# Patient Record
Sex: Male | Born: 1980 | Race: White | Hispanic: No | Marital: Married | Smoking: Never smoker
Health system: Southern US, Community
[De-identification: ages and names within clinical notes are randomized; demographics above are authoritative.]

## PROBLEM LIST (undated history)

## (undated) DIAGNOSIS — T7840XA Allergy, unspecified, initial encounter: Secondary | ICD-10-CM

## (undated) DIAGNOSIS — F419 Anxiety disorder, unspecified: Secondary | ICD-10-CM

## (undated) HISTORY — DX: Allergy, unspecified, initial encounter: T78.40XA

## (undated) HISTORY — DX: Anxiety disorder, unspecified: F41.9

---

## 2003-08-20 ENCOUNTER — Encounter: Admission: RE | Admit: 2003-08-20 | Discharge: 2003-08-20 | Payer: Self-pay | Admitting: Internal Medicine

## 2012-10-30 ENCOUNTER — Ambulatory Visit (INDEPENDENT_AMBULATORY_CARE_PROVIDER_SITE_OTHER): Payer: 59 | Admitting: Emergency Medicine

## 2012-10-30 VITALS — BP 112/80 | HR 70 | Temp 97.7°F | Resp 16 | Ht 74.0 in | Wt 198.0 lb

## 2012-10-30 DIAGNOSIS — M549 Dorsalgia, unspecified: Secondary | ICD-10-CM

## 2012-10-30 DIAGNOSIS — R070 Pain in throat: Secondary | ICD-10-CM

## 2012-10-30 MED ORDER — CYCLOBENZAPRINE HCL 10 MG PO TABS: ORAL_TABLET | ORAL | Status: DC

## 2012-10-30 MED ORDER — MELOXICAM 15 MG PO TABS: 15.0000 mg | ORAL_TABLET | Freq: Every day | ORAL | Status: DC

## 2012-10-30 NOTE — Patient Instructions (Addendum)
Take the muscle relaxer once a day with food. Take Flexeril at night. Stretch after hot shower. The spot on your throat is a tonsillith. Gargle with salt water and it should come out with food.  Low Back Strain with Rehab A strain is an injury in which a tendon or muscle is torn. The muscles and tendons of the lower back are vulnerable to strains. However, these muscles and tendons are very strong and require a great force to be injured. Strains are classified into three categories. Grade 1 strains cause pain, but the tendon is not lengthened. Grade 2 strains include a lengthened ligament, due to the ligament being stretched or partially ruptured. With grade 2 strains there is still function, although the function may be decreased. Grade 3 strains involve a complete tear of the tendon or muscle, and function is usually impaired. SYMPTOMS   Pain in the lower back.  Pain that affects one side more than the other.  Pain that gets worse with movement and may be felt in the hip, buttocks, or back of the thigh.  Muscle spasms of the muscles in the back.  Swelling along the muscles of the back.  Loss of strength of the back muscles.  Crackling sound (crepitation) when the muscles are touched. CAUSES  Lower back strains occur when a force is placed on the muscles or tendons that is greater than they can handle. Common causes of injury include:  Prolonged overuse of the muscle-tendon units in the lower back, usually from incorrect posture.  A single violent injury or force applied to the back. RISK INCREASES WITH:  Sports that involve twisting forces on the spine or a lot of bending at the waist (football, rugby, weightlifting, bowling, golf, tennis, speed skating, racquetball, swimming, running, gymnastics, diving).  Poor strength and flexibility.  Failure to warm up properly before activity.  Family history of lower back pain or disk disorders.  Previous back injury or surgery  (especially fusion).  Poor posture with lifting, especially heavy objects.  Prolonged sitting, especially with poor posture. PREVENTION   Learn and use proper posture when sitting or lifting (maintain proper posture when sitting, lift using the knees and legs, not at the waist).  Warm up and stretch properly before activity.  Allow for adequate recovery between workouts.  Maintain physical fitness:  Strength, flexibility, and endurance.  Cardiovascular fitness. PROGNOSIS  If treated properly, lower back strains usually heal within 6 weeks. RELATED COMPLICATIONS   Recurring symptoms, resulting in a chronic problem.  Chronic inflammation, scarring, and partial muscle-tendon tear.  Delayed healing or resolution of symptoms.  Prolonged disability. TREATMENT  Treatment first involves the use of ice and medicine, to reduce pain and inflammation. The use of strengthening and stretching exercises may help reduce pain with activity. These exercises may be performed at home or with a therapist. Severe injuries may require referral to a therapist for further evaluation and treatment, such as ultrasound. Your caregiver may advise that you wear a back brace or corset, to help reduce pain and discomfort. Often, prolonged bed rest results in greater harm then benefit. Corticosteroid injections may be recommended. However, these should be reserved for the most serious cases. It is important to avoid using your back when lifting objects. At night, sleep on your back on a firm mattress with a pillow placed under your knees. If non-surgical treatment is unsuccessful, surgery may be needed.  MEDICATION   If pain medicine is needed, nonsteroidal anti-inflammatory medicines (aspirin and ibuprofen),  or other minor pain relievers (acetaminophen), are often advised.  Do not take pain medicine for 7 days before surgery.  Prescription pain relievers may be given, if your caregiver thinks they are needed.  Use only as directed and only as much as you need.  Ointments applied to the skin may be helpful.  Corticosteroid injections may be given by your caregiver. These injections should be reserved for the most serious cases, because they may only be given a certain number of times. HEAT AND COLD  Cold treatment (icing) should be applied for 10 to 15 minutes every 2 to 3 hours for inflammation and pain, and immediately after activity that aggravates your symptoms. Use ice packs or an ice massage.  Heat treatment may be used before performing stretching and strengthening activities prescribed by your caregiver, physical therapist, or athletic trainer. Use a heat pack or a warm water soak. SEEK MEDICAL CARE IF:   Symptoms get worse or do not improve in 2 to 4 weeks, despite treatment.  You develop numbness, weakness, or loss of bowel or bladder function.  New, unexplained symptoms develop. (Drugs used in treatment may produce side effects.) EXERCISES  RANGE OF MOTION (ROM) AND STRETCHING EXERCISES - Low Back Strain Most people with lower back pain will find that their symptoms get worse with excessive bending forward (flexion) or arching at the lower back (extension). The exercises which will help resolve your symptoms will focus on the opposite motion.  Your physician, physical therapist or athletic trainer will help you determine which exercises will be most helpful to resolve your lower back pain. Do not complete any exercises without first consulting with your caregiver. Discontinue any exercises which make your symptoms worse until you speak to your caregiver.  If you have pain, numbness or tingling which travels down into your buttocks, leg or foot, the goal of the therapy is for these symptoms to move closer to your back and eventually resolve. Sometimes, these leg symptoms will get better, but your lower back pain may worsen. This is typically an indication of progress in your rehabilitation.  Be very alert to any changes in your symptoms and the activities in which you participated in the 24 hours prior to the change. Sharing this information with your caregiver will allow him/her to most efficiently treat your condition.  These exercises may help you when beginning to rehabilitate your injury. Your symptoms may resolve with or without further involvement from your physician, physical therapist or athletic trainer. While completing these exercises, remember:  Restoring tissue flexibility helps normal motion to return to the joints. This allows healthier, less painful movement and activity.  An effective stretch should be held for at least 30 seconds.  A stretch should never be painful. You should only feel a gentle lengthening or release in the stretched tissue. FLEXION RANGE OF MOTION AND STRETCHING EXERCISES: STRETCH  Flexion, Single Knee to Chest   Lie on a firm bed or floor with both legs extended in front of you.  Keeping one leg in contact with the floor, bring your opposite knee to your chest. Hold your leg in place by either grabbing behind your thigh or at your knee.  Pull until you feel a gentle stretch in your lower back. Hold __________ seconds.  Slowly release your grasp and repeat the exercise with the opposite side. Repeat __________ times. Complete this exercise __________ times per day.  STRETCH  Flexion, Double Knee to Chest   Lie on a firm bed  or floor with both legs extended in front of you.  Keeping one leg in contact with the floor, bring your opposite knee to your chest.  Tense your stomach muscles to support your back and then lift your other knee to your chest. Hold your legs in place by either grabbing behind your thighs or at your knees.  Pull both knees toward your chest until you feel a gentle stretch in your lower back. Hold __________ seconds.  Tense your stomach muscles and slowly return one leg at a time to the floor. Repeat __________  times. Complete this exercise __________ times per day.  STRETCH  Low Trunk Rotation  Lie on a firm bed or floor. Keeping your legs in front of you, bend your knees so they are both pointed toward the ceiling and your feet are flat on the floor.  Extend your arms out to the side. This will stabilize your upper body by keeping your shoulders in contact with the floor.  Gently and slowly drop both knees together to one side until you feel a gentle stretch in your lower back. Hold for __________ seconds.  Tense your stomach muscles to support your lower back as you bring your knees back to the starting position. Repeat the exercise to the other side. Repeat __________ times. Complete this exercise __________ times per day  EXTENSION RANGE OF MOTION AND FLEXIBILITY EXERCISES: STRETCH  Extension, Prone on Elbows   Lie on your stomach on the floor, a bed will be too soft. Place your palms about shoulder width apart and at the height of your head.  Place your elbows under your shoulders. If this is too painful, stack pillows under your chest.  Allow your body to relax so that your hips drop lower and make contact more completely with the floor.  Hold this position for __________ seconds.  Slowly return to lying flat on the floor. Repeat __________ times. Complete this exercise __________ times per day.  RANGE OF MOTION  Extension, Prone Press Ups  Lie on your stomach on the floor, a bed will be too soft. Place your palms about shoulder width apart and at the height of your head.  Keeping your back as relaxed as possible, slowly straighten your elbows while keeping your hips on the floor. You may adjust the placement of your hands to maximize your comfort. As you gain motion, your hands will come more underneath your shoulders.  Hold this position __________ seconds.  Slowly return to lying flat on the floor. Repeat __________ times. Complete this exercise __________ times per day.  RANGE  OF MOTION- Quadruped, Neutral Spine   Assume a hands and knees position on a firm surface. Keep your hands under your shoulders and your knees under your hips. You may place padding under your knees for comfort.  Drop your head and point your tail bone toward the ground below you. This will round out your lower back like an angry cat. Hold this position for __________ seconds.  Slowly lift your head and release your tail bone so that your back sags into a large arch, like an old horse.  Hold this position for __________ seconds.  Repeat this until you feel limber in your lower back.  Now, find your "sweet spot." This will be the most comfortable position somewhere between the two previous positions. This is your neutral spine. Once you have found this position, tense your stomach muscles to support your lower back.  Hold this position for __________  seconds. Repeat __________ times. Complete this exercise __________ times per day.  STRENGTHENING EXERCISES - Low Back Strain These exercises may help you when beginning to rehabilitate your injury. These exercises should be done near your "sweet spot." This is the neutral, low-back arch, somewhere between fully rounded and fully arched, that is your least painful position. When performed in this safe range of motion, these exercises can be used for people who have either a flexion or extension based injury. These exercises may resolve your symptoms with or without further involvement from your physician, physical therapist or athletic trainer. While completing these exercises, remember:   Muscles can gain both the endurance and the strength needed for everyday activities through controlled exercises.  Complete these exercises as instructed by your physician, physical therapist or athletic trainer. Increase the resistance and repetitions only as guided.  You may experience muscle soreness or fatigue, but the pain or discomfort you are trying to  eliminate should never worsen during these exercises. If this pain does worsen, stop and make certain you are following the directions exactly. If the pain is still present after adjustments, discontinue the exercise until you can discuss the trouble with your caregiver. STRENGTHENING Deep Abdominals, Pelvic Tilt  Lie on a firm bed or floor. Keeping your legs in front of you, bend your knees so they are both pointed toward the ceiling and your feet are flat on the floor.  Tense your lower abdominal muscles to press your lower back into the floor. This motion will rotate your pelvis so that your tail bone is scooping upwards rather than pointing at your feet or into the floor.  With a gentle tension and even breathing, hold this position for __________ seconds. Repeat __________ times. Complete this exercise __________ times per day.  STRENGTHENING  Abdominals, Crunches   Lie on a firm bed or floor. Keeping your legs in front of you, bend your knees so they are both pointed toward the ceiling and your feet are flat on the floor. Cross your arms over your chest.  Slightly tip your chin down without bending your neck.  Tense your abdominals and slowly lift your trunk high enough to just clear your shoulder blades. Lifting higher can put excessive stress on the lower back and does not further strengthen your abdominal muscles.  Control your return to the starting position. Repeat __________ times. Complete this exercise __________ times per day.  STRENGTHENING  Quadruped, Opposite UE/LE Lift   Assume a hands and knees position on a firm surface. Keep your hands under your shoulders and your knees under your hips. You may place padding under your knees for comfort.  Find your neutral spine and gently tense your abdominal muscles so that you can maintain this position. Your shoulders and hips should form a rectangle that is parallel with the floor and is not twisted.  Keeping your trunk steady,  lift your right hand no higher than your shoulder and then your left leg no higher than your hip. Make sure you are not holding your breath. Hold this position __________ seconds.  Continuing to keep your abdominal muscles tense and your back steady, slowly return to your starting position. Repeat with the opposite arm and leg. Repeat __________ times. Complete this exercise __________ times per day.  STRENGTHENING  Lower Abdominals, Double Knee Lift  Lie on a firm bed or floor. Keeping your legs in front of you, bend your knees so they are both pointed toward the ceiling and your  feet are flat on the floor.  Tense your abdominal muscles to brace your lower back and slowly lift both of your knees until they come over your hips. Be certain not to hold your breath.  Hold __________ seconds. Using your abdominal muscles, return to the starting position in a slow and controlled manner. Repeat __________ times. Complete this exercise __________ times per day.  POSTURE AND BODY MECHANICS CONSIDERATIONS - Low Back Strain Keeping correct posture when sitting, standing or completing your activities will reduce the stress put on different body tissues, allowing injured tissues a chance to heal and limiting painful experiences. The following are general guidelines for improved posture. Your physician or physical therapist will provide you with any instructions specific to your needs. While reading these guidelines, remember:  The exercises prescribed by your provider will help you have the flexibility and strength to maintain correct postures.  The correct posture provides the best environment for your joints to work. All of your joints have less wear and tear when properly supported by a spine with good posture. This means you will experience a healthier, less painful body.  Correct posture must be practiced with all of your activities, especially prolonged sitting and standing. Correct posture is as  important when doing repetitive low-stress activities (typing) as it is when doing a single heavy-load activity (lifting). RESTING POSITIONS Consider which positions are most painful for you when choosing a resting position. If you have pain with flexion-based activities (sitting, bending, stooping, squatting), choose a position that allows you to rest in a less flexed posture. You would want to avoid curling into a fetal position on your side. If your pain worsens with extension-based activities (prolonged standing, working overhead), avoid resting in an extended position such as sleeping on your stomach. Most people will find more comfort when they rest with their spine in a more neutral position, neither too rounded nor too arched. Lying on a non-sagging bed on your side with a pillow between your knees, or on your back with a pillow under your knees will often provide some relief. Keep in mind, being in any one position for a prolonged period of time, no matter how correct your posture, can still lead to stiffness. PROPER SITTING POSTURE In order to minimize stress and discomfort on your spine, you must sit with correct posture. Sitting with good posture should be effortless for a healthy body. Returning to good posture is a gradual process. Many people can work toward this most comfortably by using various supports until they have the flexibility and strength to maintain this posture on their own. When sitting with proper posture, your ears will fall over your shoulders and your shoulders will fall over your hips. You should use the back of the chair to support your upper back. Your lower back will be in a neutral position, just slightly arched. You may place a small pillow or folded towel at the base of your lower back for support.  When working at a desk, create an environment that supports good, upright posture. Without extra support, muscles tire, which leads to excessive strain on joints and other  tissues. Keep these recommendations in mind: CHAIR:  A chair should be able to slide under your desk when your back makes contact with the back of the chair. This allows you to work closely.  The chair's height should allow your eyes to be level with the upper part of your monitor and your hands to be slightly lower than  your elbows. BODY POSITION  Your feet should make contact with the floor. If this is not possible, use a foot rest.  Keep your ears over your shoulders. This will reduce stress on your neck and lower back. INCORRECT SITTING POSTURES  If you are feeling tired and unable to assume a healthy sitting posture, do not slouch or slump. This puts excessive strain on your back tissues, causing more damage and pain. Healthier options include:  Using more support, like a lumbar pillow.  Switching tasks to something that requires you to be upright or walking.  Talking a brief walk.  Lying down to rest in a neutral-spine position. PROLONGED STANDING WHILE SLIGHTLY LEANING FORWARD  When completing a task that requires you to lean forward while standing in one place for a long time, place either foot up on a stationary 2-4 inch high object to help maintain the best posture. When both feet are on the ground, the lower back tends to lose its slight inward curve. If this curve flattens (or becomes too large), then the back and your other joints will experience too much stress, tire more quickly, and can cause pain. CORRECT STANDING POSTURES Proper standing posture should be assumed with all daily activities, even if they only take a few moments, like when brushing your teeth. As in sitting, your ears should fall over your shoulders and your shoulders should fall over your hips. You should keep a slight tension in your abdominal muscles to brace your spine. Your tailbone should point down to the ground, not behind your body, resulting in an over-extended swayback posture.  INCORRECT STANDING  POSTURES  Common incorrect standing postures include a forward head, locked knees and/or an excessive swayback. WALKING Walk with an upright posture. Your ears, shoulders and hips should all line-up. PROLONGED ACTIVITY IN A FLEXED POSITION When completing a task that requires you to bend forward at your waist or lean over a low surface, try to find a way to stabilize 3 out of 4 of your limbs. You can place a hand or elbow on your thigh or rest a knee on the surface you are reaching across. This will provide you more stability so that your muscles do not fatigue as quickly. By keeping your knees relaxed, or slightly bent, you will also reduce stress across your lower back. CORRECT LIFTING TECHNIQUES DO :   Assume a wide stance. This will provide you more stability and the opportunity to get as close as possible to the object which you are lifting.  Tense your abdominals to brace your spine. Bend at the knees and hips. Keeping your back locked in a neutral-spine position, lift using your leg muscles. Lift with your legs, keeping your back straight.  Test the weight of unknown objects before attempting to lift them.  Try to keep your elbows locked down at your sides in order get the best strength from your shoulders when carrying an object.  Always ask for help when lifting heavy or awkward objects. INCORRECT LIFTING TECHNIQUES DO NOT:   Lock your knees when lifting, even if it is a small object.  Bend and twist. Pivot at your feet or move your feet when needing to change directions.  Assume that you can safely pick up even a paper clip without proper posture. Document Released: 06/26/2005 Document Revised: 09/18/2011 Document Reviewed: 10/08/2008 [facility] Patient Information 2013 [place], [place].

## 2012-10-30 NOTE — Progress Notes (Signed)
  Subjective:    Patient ID: Dillon Horne, male    DOB: 1980/12/26, 32 y.o.   MRN: [id number]  HPI 32 Year old Pt presentss to clinic today with 2 complaints.  First complaint is he is having some LBP that is Lt sided and radiating up towards his mid-back. Pt states he mowed his lawn Monday and lifted a box about 30 pounds yesterday afternoon. He noticed the pain yesterday after lifting the box from the ground to up over his head to put into a closet. At some point he then reached up and got the box down again. His back began to get "hot" and tense up yesterday evening- he applied ice to the area and took Advil yesterday at 5pm and at 10pm- he got a little relief. This morning he woke with his back more tensed up and tried to run some errands- he has not taken anything yet today. Second complaint is his scratchy, sore throat he has had off and on for about two months. He states that it comes and goes- when he feels fatigued and run down is when his throat hurts the worst. He noticed last week a white spot on his tonsil.     Review of Systems  Constitutional: Negative for fever and chills.  Musculoskeletal: Positive for back pain.       Objective:   Physical Exam  Constitutional: He is oriented to person, place, and time. He appears well-developed and well-nourished.  HENT:  Head: Normocephalic and atraumatic.  Small white patch on R tonsil. No acute erythema.  Ears clear. No signs of abscess.  Neck: Normal range of motion. Neck supple.  No lymphadenopathy  Cardiovascular: Normal rate, regular rhythm and normal heart sounds.   Pulmonary/Chest: Effort normal and breath sounds normal.  Musculoskeletal:       Lumbar back: He exhibits decreased range of motion, tenderness (mild Left sided) and spasm. He exhibits no bony tenderness and no swelling.       Arms: Hypertonic muscles on the left lower back.  Straight leg raise is negative bilaterally. DTRs normal.  Neurological: He is alert  and oriented to person, place, and time.  Skin: Skin is warm and dry.   Small tonsillith on R side.         Assessment & Plan:  Back strain. Given muscle relaxer to take with food. Continue over the counter NSAIDs for pain. Spot on throat is a tonsillith. Attempted to remove with swab, but it did not come out. Advised to gargle saltwater until it comes out.

## 2012-11-03 ENCOUNTER — Telehealth: Payer: Self-pay

## 2012-11-03 NOTE — Telephone Encounter (Signed)
PATIENT'S WIFE CALLED TO SAY THAT DR. DAUB SAW HER HUSBAND FOR BACK PAIN. DR. Cleta Alberts GAVE HIM AN ANTI-INFLAMMATORY AND A MUSCLE RELAXER. TODAY HE IS IN A LOT OF PAIN. SHE WOULD LIKE TO KNOW WHAT ELSE HE CAN DO? HE HAS NOT TAKEN ANY TYLENOL OR ADVIL.  BEST PHONE [phone] ([facility])   PHARMACY CHOICE IS [facility] ON [address].  MBC

## 2012-11-03 NOTE — Telephone Encounter (Signed)
Called and discussed with patient. He can take his Flexeril 3 times a day during the day and supplement this with extra strength Tylenol as needed

## 2013-07-25 ENCOUNTER — Other Ambulatory Visit: Payer: Self-pay | Admitting: Internal Medicine

## 2013-07-25 DIAGNOSIS — J329 Chronic sinusitis, unspecified: Secondary | ICD-10-CM

## 2013-08-01 ENCOUNTER — Ambulatory Visit
Admission: RE | Admit: 2013-08-01 | Discharge: 2013-08-01 | Disposition: A | Discharge status: Home / Self Care | Payer: 59 | Source: Ambulatory Visit | Attending: Internal Medicine | Admitting: Internal Medicine

## 2013-08-01 DIAGNOSIS — J329 Chronic sinusitis, unspecified: Secondary | ICD-10-CM

## 2014-06-15 ENCOUNTER — Ambulatory Visit (INDEPENDENT_AMBULATORY_CARE_PROVIDER_SITE_OTHER): Payer: 59 | Admitting: Internal Medicine

## 2014-06-15 VITALS — BP 122/84 | HR 74 | Temp 98.2°F | Resp 18 | Ht 75.0 in | Wt 204.8 lb

## 2014-06-15 DIAGNOSIS — R6883 Chills (without fever): Secondary | ICD-10-CM

## 2014-06-15 DIAGNOSIS — R5383 Other fatigue: Secondary | ICD-10-CM

## 2014-06-15 DIAGNOSIS — J039 Acute tonsillitis, unspecified: Secondary | ICD-10-CM

## 2014-06-15 DIAGNOSIS — R61 Generalized hyperhidrosis: Secondary | ICD-10-CM

## 2014-06-15 LAB — POCT RAPID STREP A (OFFICE): Rapid Strep A Screen: NEGATIVE

## 2014-06-15 MED ORDER — AZITHROMYCIN 500 MG PO TABS: 500.0000 mg | ORAL_TABLET | Freq: Every day | ORAL | Status: DC

## 2014-06-15 NOTE — Progress Notes (Signed)
   Subjective:    Patient ID: Dillon Horne, male    DOB: Feb 14, 1981, 33 y.o.   MRN: [id number]  HPI 5days, st,fatigue, diaphoresis. Also a sore in beard after shaving, has now resolved.   Review of Systems     Objective:   Physical Exam  Constitutional: He is oriented to person, place, and time. He appears well-developed and well-nourished. No distress.  HENT:  Head: Normocephalic.  Right Ear: External ear normal.  Left Ear: External ear normal.  Nose: Nose normal.  Mouth/Throat: Oropharyngeal exudate present.  Eyes: Conjunctivae and EOM are normal. Pupils are equal, round, and reactive to light.  Neck: Normal range of motion. Neck supple.  Cardiovascular: Normal rate, regular rhythm and normal heart sounds.   Musculoskeletal: Normal range of motion.  Neurological: He is alert and oriented to person, place, and time. He exhibits normal muscle tone. Coordination normal.  Skin: No rash noted.  Psychiatric: He has a normal mood and affect.  Vitals reviewed. No lesion seen on face  Results for orders placed or performed in visit on 06/15/14  POCT rapid strep A  Result Value Ref Range   Rapid Strep A Screen Negative Negative         Assessment & Plan:  Tonsillitis Zithromax 500mg Christen Bame/Throat care/Take 3 days zithromax

## 2014-06-15 NOTE — Patient Instructions (Signed)

## 2015-01-22 IMAGING — CT CT MAXILLOFACIAL W/O CM
3 of 6 series · 16 of 47 positions shown, 19 images · non-contrast
Comparison: None.

CLINICAL DATA: Sinus pressure. Frontal and occipital headache.
Drainage. Congestion.

EXAM:
CT MAXILLOFACIAL WITHOUT CONTRAST
TECHNIQUE: Multidetector CT imaging of the maxillofacial structures was
performed. Multiplanar CT image reconstructions were also generated.
A small metallic BB was placed on the right temple in order to
reliably differentiate right from left.

[Series 200: cor · coronal · 0.29mm/px · 3 of 71 slices shown]
[im 24/71  bone]
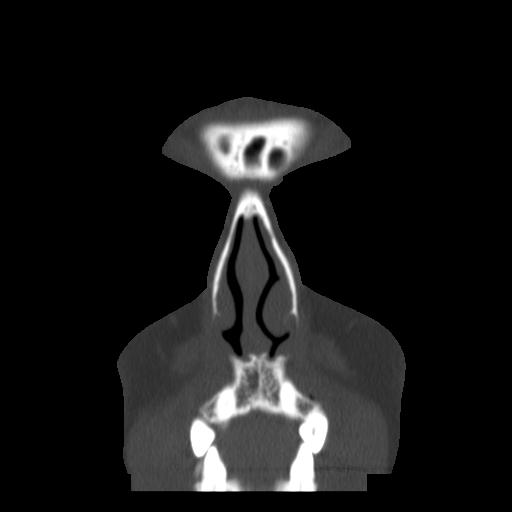
[im 32/71  bone]
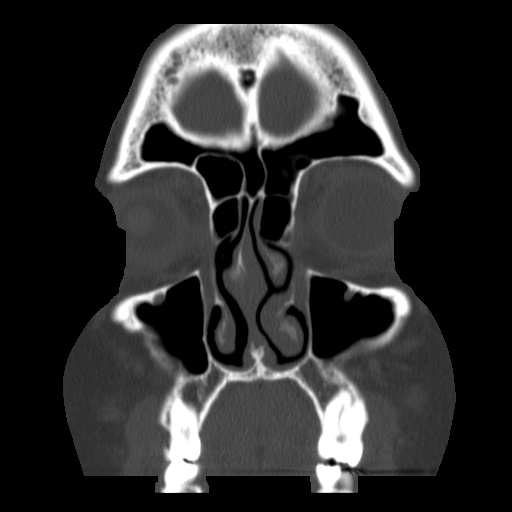
[im 39/71  bone]
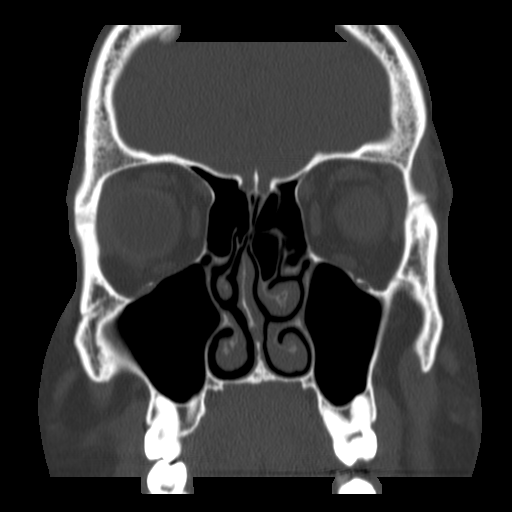

[Series 601: coronal facial · coronal · 0.29mm/px · 2 of 74 slices shown]
[im 25/74  bone]
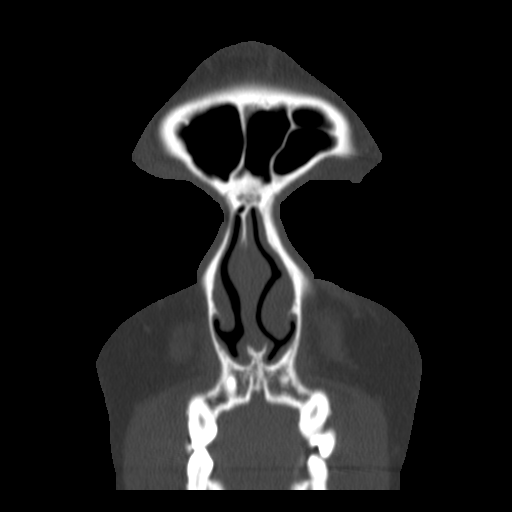
[im 49/74  bone]
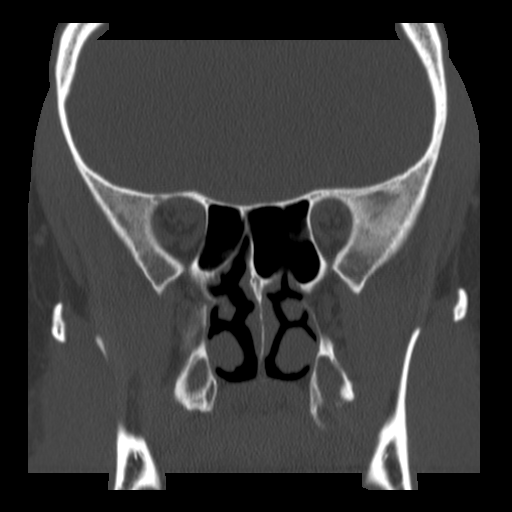

[Series 602: sagittal facial · sagittal · 0.29mm/px · 11 of 75 slices shown, 14 images]
[im 5/75  brain]
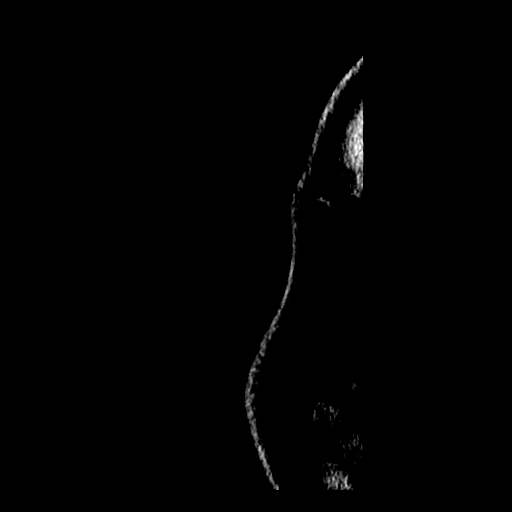
[im 5/75  bone]
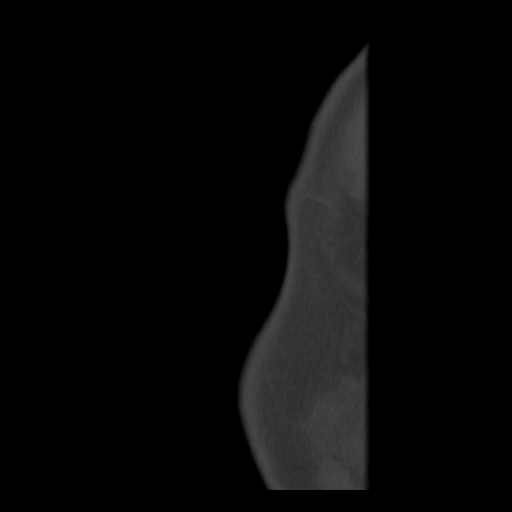
[im 14/75  bone]
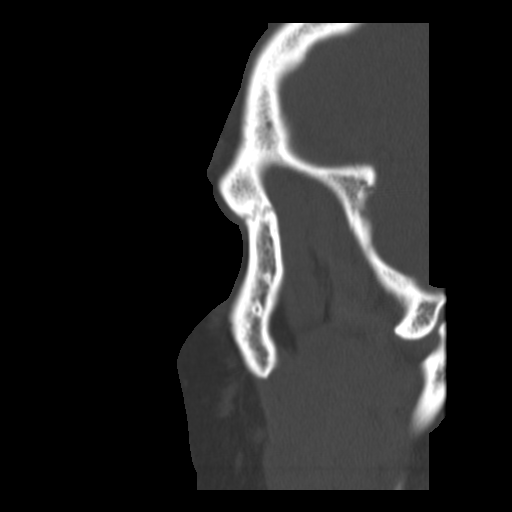
[im 19/75  bone]
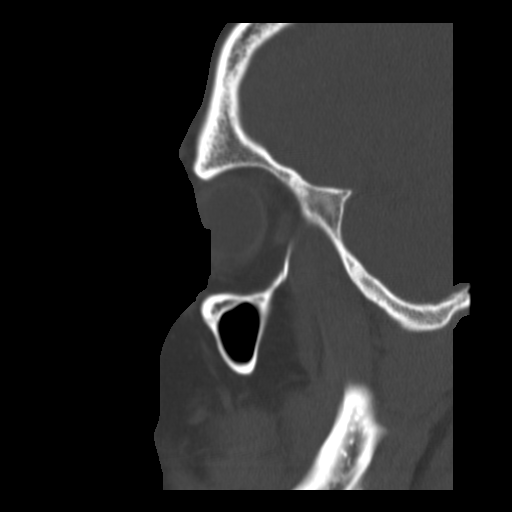
[im 24/75  bone]
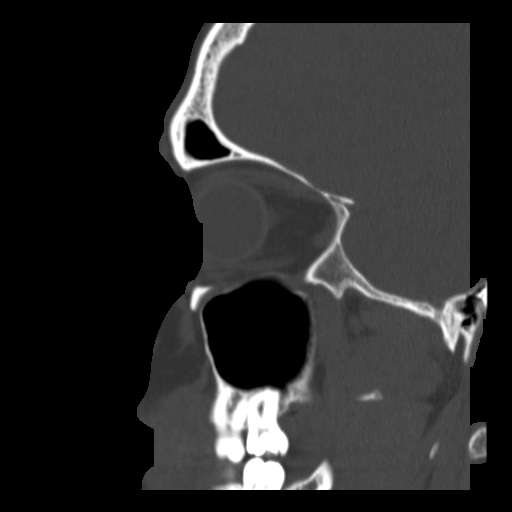
[im 33/75  brain]
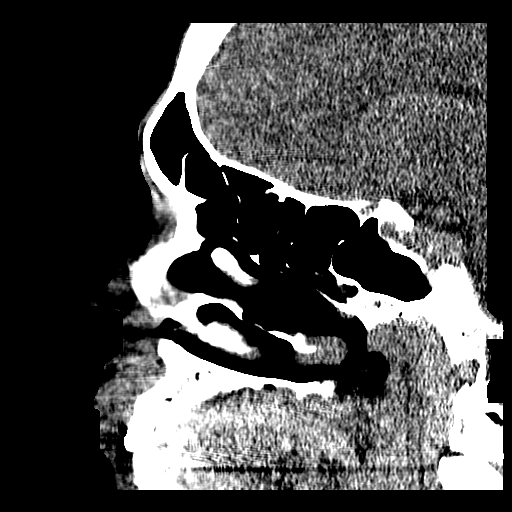
[im 33/75  bone]
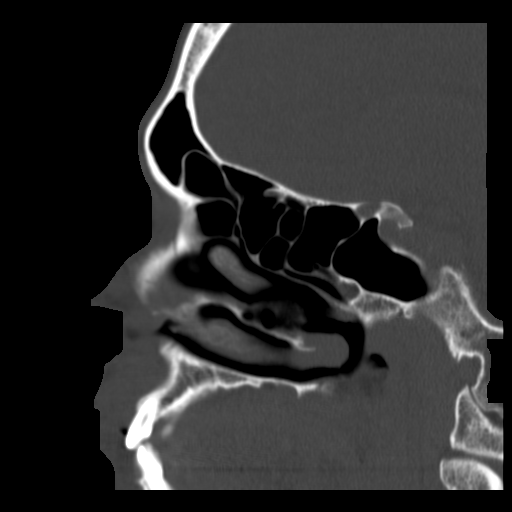
[im 38/75  bone]
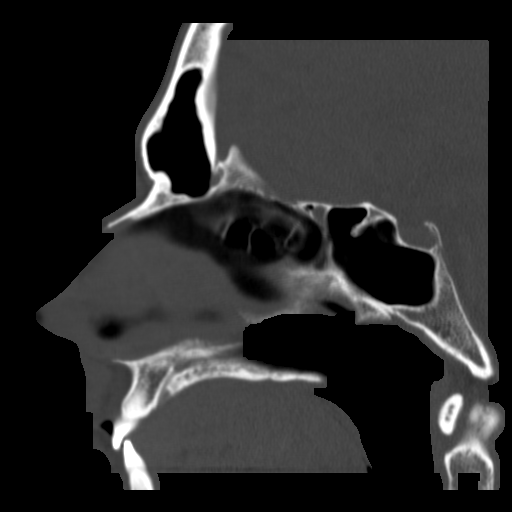
[im 42/75  bone]
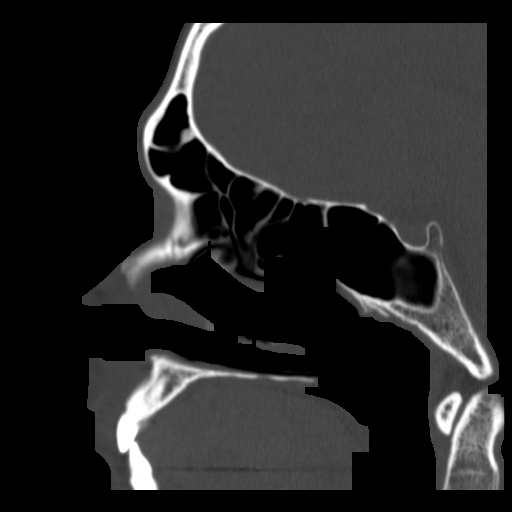
[im 51/75  bone]
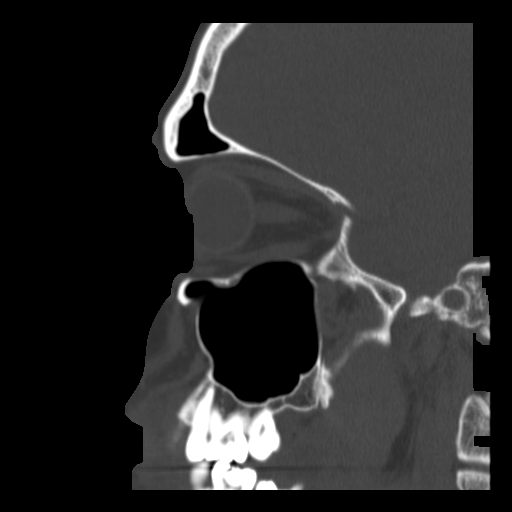
[im 56/75  brain]
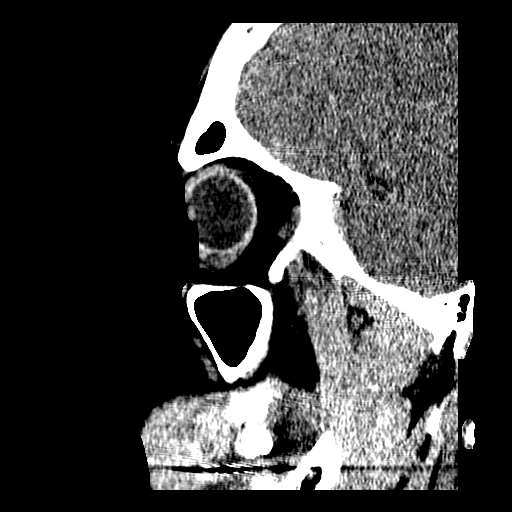
[im 56/75  bone]
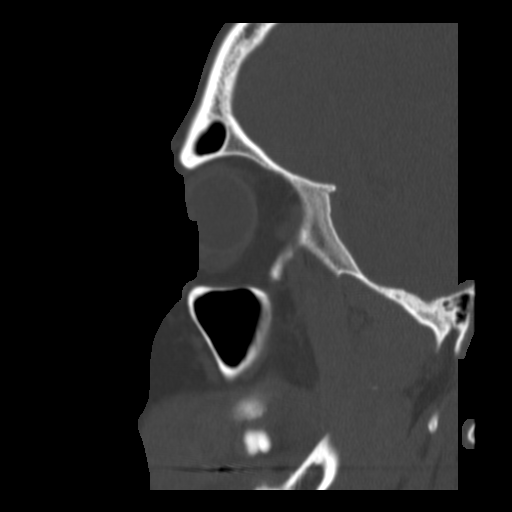
[im 61/75  bone]
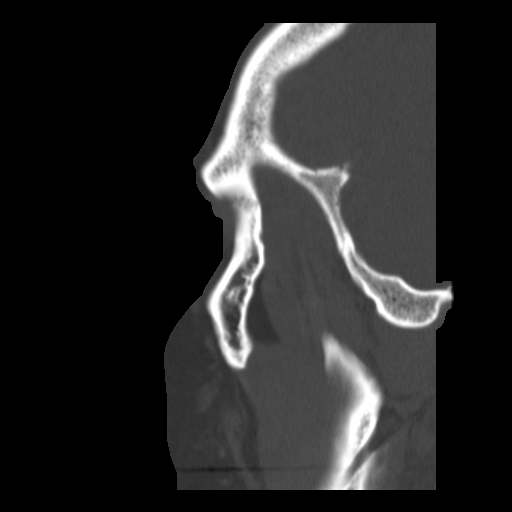
[im 70/75  bone]
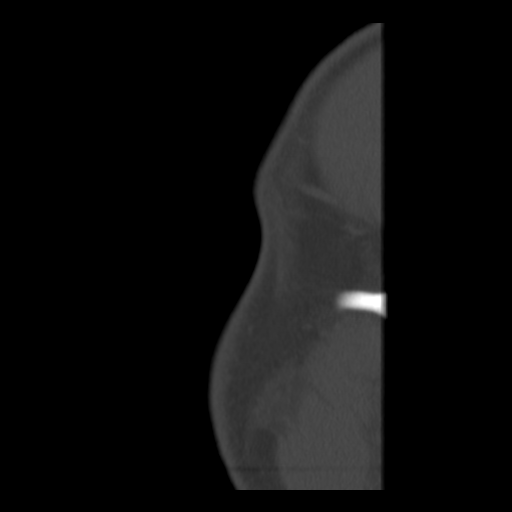

[16 of 47 positions shown; findings below may reference images not displayed]

FINDINGS: Frontal sinuses are clear. There is an insignificant 4 mm osteoma.
Ethmoid sinuses are clear. Sphenoid sinus is clear. The maxillary
sinuses are clear. No fluid, mucosal thickening, polyp, cyst or
mass.

The patient does have nasal septal deviation towards the right
measuring 5 mm. There is concha bullosa on the left in association
with the nasal septal deviation. Both ostiomeatal complexes are
thin, but obviously sufficiently patent given the absence of any
maxillary sinus inflammatory change. Nasal passages appear to be, in
general, sufficiently patent. Mucosa on the left is somewhat
prominent presumably related to nasal cycle.
IMPRESSION: No evidence of chronic or acute sinus inflammatory disease.

Nasal septal deviation towards the right measuring 5 mm. Associated
left concha bullosa. See above for full discussion.

## 2015-02-05 ENCOUNTER — Ambulatory Visit (INDEPENDENT_AMBULATORY_CARE_PROVIDER_SITE_OTHER): Payer: 59 | Admitting: Family Medicine

## 2015-02-05 ENCOUNTER — Ambulatory Visit (INDEPENDENT_AMBULATORY_CARE_PROVIDER_SITE_OTHER): Payer: 59

## 2015-02-05 VITALS — BP 106/80 | HR 60 | Temp 98.8°F | Resp 16 | Ht 75.0 in | Wt 207.4 lb

## 2015-02-05 DIAGNOSIS — M5441 Lumbago with sciatica, right side: Secondary | ICD-10-CM

## 2015-02-05 DIAGNOSIS — B360 Pityriasis versicolor: Secondary | ICD-10-CM

## 2015-02-05 MED ORDER — KETOCONAZOLE 2 % EX CREA: 1.0000 "application " | TOPICAL_CREAM | Freq: Every day | CUTANEOUS | Status: AC

## 2015-02-05 MED ORDER — PREDNISONE 20 MG PO TABS: ORAL_TABLET | ORAL | Status: AC

## 2015-02-05 MED ORDER — CYCLOBENZAPRINE HCL ER 15 MG PO CP24: 15.0000 mg | ORAL_CAPSULE | Freq: Every day | ORAL | Status: AC | PRN

## 2015-02-05 NOTE — Progress Notes (Signed)
[facility] [address], [place] [address] [phone]  Date:  02/05/2015   Name:  Dillon Horne   DOB:  September 21, 1980   MRN:  [id number]  PCP:  No primary care provider on file.    Chief Complaint: Back Injury and Rash   History of Present Illness:  This is a 34 y.o. male who is presenting with back pain x 2 months. States 2 months ago he injured his back. He was carrying speakers and fell through a hole in the ground. Pain went away and he was symptom free for one month and then pain came back 2 weeks ago. Pain is midline. Standing causes sharp pain. Has some soreness and numbness down his right leg to mid thigh. Denies weakness or problems with bowel or bladder. He pulled a muscle in back several years ago but states this does not feel similar. Has taken advil 800 mg and helps.  He is also complaining of a rash on his back. Located between shoulder blades. Described as discoloration. Rash is nonpruritic and nontender. He was dx'd with tinea versicolor 1 year ago. He was told to apply selsun blue daily. Went away until 6 months ago. He has again been using selsun blue daily x 1 month but no change.   Review of Systems:  Review of Systems See HPI  There are no active problems to display for this patient.   Prior to Admission medications   Medication Sig Start Date End Date Taking? Authorizing Provider  citalopram (CELEXA) 20 MG tablet Take 20 mg by mouth daily.   Yes Historical Provider, MD  traZODone (DESYREL) 50 MG tablet Take 50 mg by mouth at bedtime.   Yes Historical Provider, MD    Allergies  Allergen Reactions  . Sulfa Antibiotics Rash    History reviewed. No pertinent past surgical history.  History  Substance Use Topics  . Smoking status: Never Smoker   . Smokeless tobacco: Not on file  . Alcohol Use: 0.0 oz/week    0 Standard drinks or equivalent per week     Comment: couple/wk    Family History  Problem Relation Age of Onset  .  Diabetes Mother   . Diabetes Father   . Multiple sclerosis Sister     Medication list has been reviewed and updated.  Physical Examination:  Physical Exam  Constitutional: He is oriented to person, place, and time. He appears well-developed and well-nourished. No distress.  HENT:  Head: Normocephalic and atraumatic.  Right Ear: Hearing normal.  Left Ear: Hearing normal.  Nose: Nose normal.  Eyes: Conjunctivae and lids are normal. Right eye exhibits no discharge. Left eye exhibits no discharge. No scleral icterus.  Cardiovascular: Normal rate, regular rhythm, normal heart sounds, intact distal pulses and normal pulses.   No murmur heard. Pulmonary/Chest: Effort normal and breath sounds normal. No respiratory distress. He has no wheezes. He has no rhonchi. He has no rales.  Musculoskeletal: Normal range of motion.       Lumbar back: He exhibits tenderness (midline). He exhibits normal range of motion and no swelling.  Mild tenderness at lateral hip Straight leg raise negative Right side faber positive for midline back pain Left side faber negative  Neurological: He is alert and oriented to person, place, and time. He has normal strength and normal reflexes. No sensory deficit. Gait normal.  Skin: Skin is warm, dry and intact.  Hyperpigmented macules on upper back in between scapula  Psychiatric:  He has a normal mood and affect. His speech is normal and behavior is normal. Thought content normal.    BP 106/80 mmHg  Pulse 60  Temp(Src) 98.8 F (37.1 C) (Oral)  Resp 16  Ht 6\' 3"  (1.905 m)  Wt 207 lb 6.4 oz (94.076 kg)  BMI 25.92 kg/m2  SpO2 98%  [facility] reading (PRIMARY) by  Dr. Patsy Lager: negative except for loss of lordosis.   Assessment and Plan:  1. Midline low back pain with right-sided sciatica Radiograph negative except for loss of lordosis. Will treat with medical therapy for now - prednisone taper and muscle relaxer. Counseled on heat, gentle massage and gentle  stretching. If not improving in 2 weeks would consider referral to ortho/pt. Counseled on return precautions. - DG Lumbar Spine Complete; Future - predniSONE (DELTASONE) 20 MG tablet; Take 3 PO QAM x3days, 2 PO QAM x3days, 1 PO QAM x3days  Dispense: 18 tablet; Refill: 0 - cyclobenzaprine (AMRIX) 15 MG 24 hr capsule; Take 1 capsule (15 mg total) by mouth daily as needed for muscle spasms.  Dispense: 30 capsule; Refill: 0  2. Tinea versicolor Ketoconazole QD x 2 weeks. Return if not getting better in 2 weeks. - ketoconazole (NIZORAL) 2 % cream; Apply 1 application topically daily.  Dispense: 15 g; Refill: 0   Nicole V. Dyke Brackett, MHS [facility]  02/06/2015

## 2015-02-05 NOTE — Patient Instructions (Signed)
Use prednisone as directed. Do not use any other products with this containing ibuprofen, naprosyn or aspirin. You MAY use tylenol with this. Take amrix at night - this is a muscle relaxer. It will make you drowsy. Heat, gentle massage and gentle stretching can help. Remain active, as inactivity can cause more pain. Don't do anything too strenuous though. If you develop new numbness, weakness or incontinence of urine or bowel, return to clinic or go to the emergency room ASAP. Return if your symptoms are not improving in 2 weeks, let me know and I will refer you to physical therapy or ortho.  Apply ketoconazole cream once a day for 2 weeks. If still not getting better, let me know.

## 2016-07-28 IMAGING — CR DG LUMBAR SPINE COMPLETE 4+V
5 series · 5 of 5 positions shown · non-contrast
Comparison: None.

CLINICAL DATA: Low back pain with right-sided sciatica. Duration 2
months.

EXAM:
LUMBAR SPINE - COMPLETE 4+ VIEW

[AP (1 of 2)]
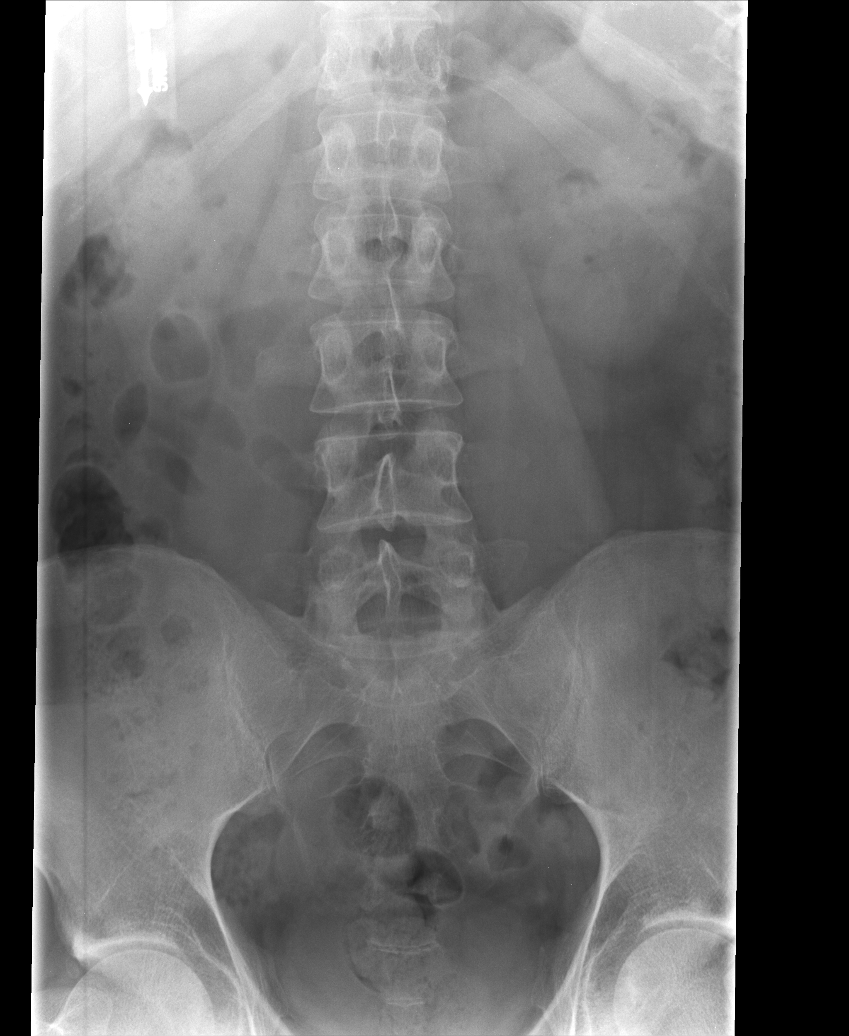

[AP (2 of 2)]
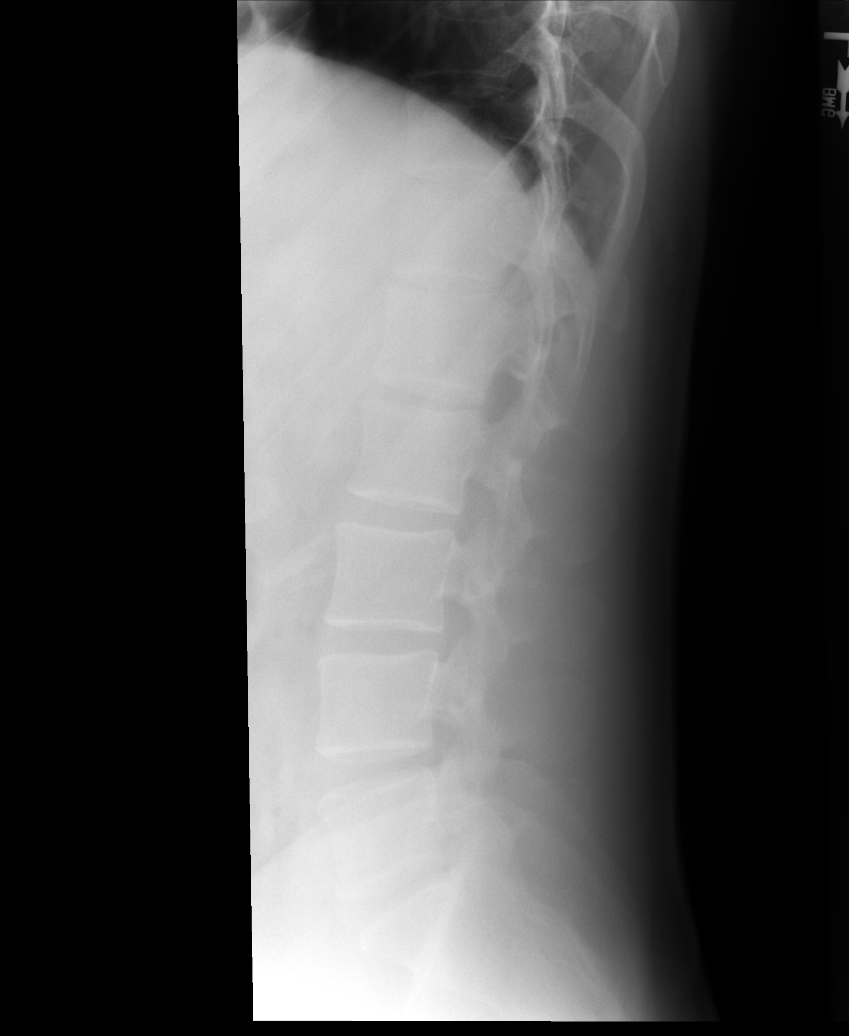

[l5 s1]
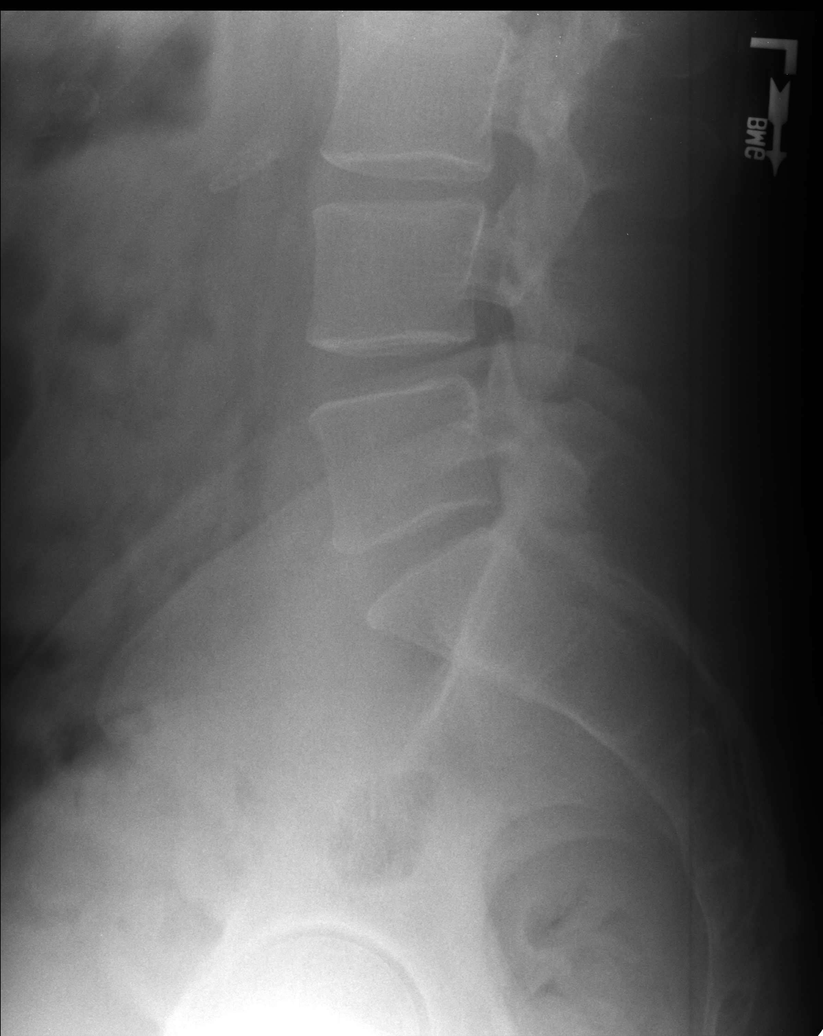

[rpo]
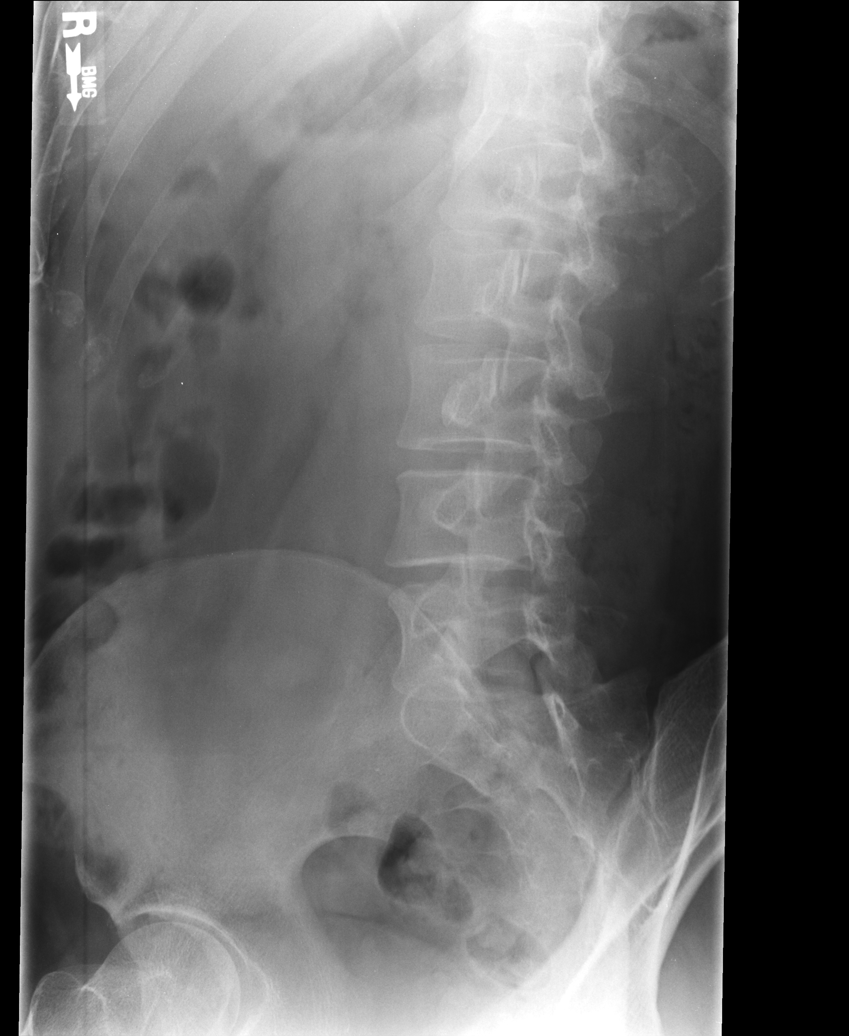

[lpo]
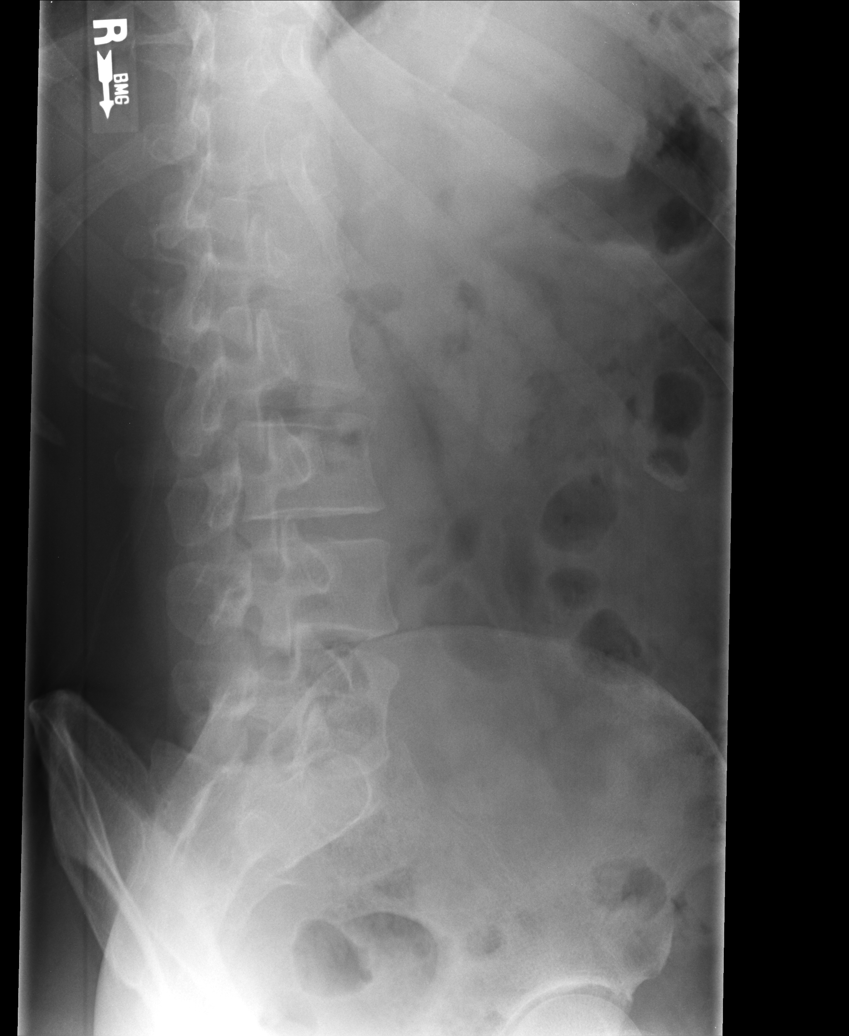

[5 of 5 positions shown; findings below may reference images not displayed]

FINDINGS: There is minimal right convex curvature at L3 and loss of lordosis.
The lumbar vertebrae are normal in height. Good preservation of
intervertebral disc spaces. Facet articulations appear unremarkable.
Sacroiliac joints appear unremarkable.
IMPRESSION: Minimal curvature.
